# Patient Record
Sex: Female | Born: 1995 | Hispanic: No | Marital: Single | Smoking: Current every day smoker
Health system: Southern US, Community
[De-identification: ages and names within clinical notes are randomized; demographics above are authoritative.]

## PROBLEM LIST (undated history)

## (undated) DIAGNOSIS — F419 Anxiety disorder, unspecified: Secondary | ICD-10-CM

---
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
## 2017-10-11 ENCOUNTER — Emergency Department (HOSPITAL_COMMUNITY): Payer: Medicaid Other

## 2017-10-11 ENCOUNTER — Other Ambulatory Visit: Payer: Self-pay

## 2017-10-11 ENCOUNTER — Encounter (HOSPITAL_COMMUNITY): Payer: Self-pay

## 2017-10-11 ENCOUNTER — Emergency Department (HOSPITAL_COMMUNITY)
Admission: EM | Admit: 2017-10-11 | Discharge: 2017-10-11 | Disposition: A | Discharge status: Home / Self Care | Payer: Medicaid Other | Attending: Emergency Medicine | Admitting: Emergency Medicine

## 2017-10-11 DIAGNOSIS — S161XXA Strain of muscle, fascia and tendon at neck level, initial encounter: Secondary | ICD-10-CM | POA: Diagnosis not present

## 2017-10-11 DIAGNOSIS — Y9389 Activity, other specified: Secondary | ICD-10-CM | POA: Diagnosis not present

## 2017-10-11 DIAGNOSIS — Y92410 Unspecified street and highway as the place of occurrence of the external cause: Secondary | ICD-10-CM | POA: Diagnosis not present

## 2017-10-11 DIAGNOSIS — S60222A Contusion of left hand, initial encounter: Secondary | ICD-10-CM

## 2017-10-11 DIAGNOSIS — R0789 Other chest pain: Secondary | ICD-10-CM | POA: Diagnosis not present

## 2017-10-11 DIAGNOSIS — F17200 Nicotine dependence, unspecified, uncomplicated: Secondary | ICD-10-CM | POA: Diagnosis not present

## 2017-10-11 DIAGNOSIS — S60052A Contusion of left little finger without damage to nail, initial encounter: Secondary | ICD-10-CM | POA: Insufficient documentation

## 2017-10-11 DIAGNOSIS — Y999 Unspecified external cause status: Secondary | ICD-10-CM | POA: Insufficient documentation

## 2017-10-11 DIAGNOSIS — R410 Disorientation, unspecified: Secondary | ICD-10-CM | POA: Insufficient documentation

## 2017-10-11 DIAGNOSIS — Z79899 Other long term (current) drug therapy: Secondary | ICD-10-CM | POA: Diagnosis not present

## 2017-10-11 DIAGNOSIS — M545 Low back pain: Secondary | ICD-10-CM | POA: Insufficient documentation

## 2017-10-11 DIAGNOSIS — M7918 Myalgia, other site: Secondary | ICD-10-CM

## 2017-10-11 DIAGNOSIS — S199XXA Unspecified injury of neck, initial encounter: Secondary | ICD-10-CM | POA: Diagnosis present

## 2017-10-11 LAB — CBC
HEMATOCRIT: 35.6 % — AB (ref 36.0–46.0)
Hemoglobin: 12 g/dL (ref 12.0–15.0)
MCH: 30.6 pg (ref 26.0–34.0)
MCHC: 33.7 g/dL (ref 30.0–36.0)
MCV: 90.8 fL (ref 78.0–100.0)
Platelets: 271 10*3/uL (ref 150–400)
RBC: 3.92 MIL/uL (ref 3.87–5.11)
RDW: 13.6 % (ref 11.5–15.5)
WBC: 5.7 10*3/uL (ref 4.0–10.5)

## 2017-10-11 LAB — BASIC METABOLIC PANEL
ANION GAP: 8 (ref 5–15)
BUN: 7 mg/dL (ref 6–20)
CHLORIDE: 108 mmol/L (ref 101–111)
CO2: 24 mmol/L (ref 22–32)
Calcium: 9.4 mg/dL (ref 8.9–10.3)
Creatinine, Ser: 0.92 mg/dL (ref 0.44–1.00)
GFR calc Af Amer: >60 mL/min (ref >60)
GFR calc non Af Amer: >60 mL/min (ref >60)
Glucose, Bld: 88 mg/dL (ref 65–99)
POTASSIUM: 3.8 mmol/L (ref 3.5–5.1)
Sodium: 140 mmol/L (ref 135–145)

## 2017-10-11 LAB — I-STAT BETA HCG BLOOD, ED (MC, WL, AP ONLY): I-stat hCG, quantitative: <5 m[IU]/mL (ref <5)

## 2017-10-11 MED ORDER — NAPROXEN 500 MG PO TABS: 500.0000 mg | ORAL_TABLET | Freq: Two times a day (BID) | ORAL | 0 refills | Status: AC

## 2017-10-11 MED ORDER — SODIUM CHLORIDE 0.9 % IV BOLUS
500.0000 mL | Freq: Once | INTRAVENOUS | Status: AC
  Administered 2017-10-11: 500 mL via INTRAVENOUS

## 2017-10-11 MED ORDER — FENTANYL CITRATE (PF) 100 MCG/2ML IJ SOLN
25.0000 ug | Freq: Once | INTRAMUSCULAR | Status: AC
  Administered 2017-10-11: 25 ug via INTRAVENOUS
  Filled 2017-10-11: qty 2

## 2017-10-11 MED ORDER — SODIUM CHLORIDE 0.9 % IV SOLN: INTRAVENOUS | Status: DC

## 2017-10-11 MED ORDER — IOHEXOL 300 MG/ML  SOLN
100.0000 mL | Freq: Once | INTRAMUSCULAR | Status: AC | PRN
  Administered 2017-10-11: 75 mL via INTRAVENOUS

## 2017-10-11 MED ORDER — ONDANSETRON HCL 4 MG/2ML IJ SOLN
4.0000 mg | Freq: Once | INTRAMUSCULAR | Status: AC
  Administered 2017-10-11: 4 mg via INTRAVENOUS
  Filled 2017-10-11: qty 2

## 2017-10-11 MED ORDER — SODIUM CHLORIDE 0.9 % IV SOLN
INTRAVENOUS | Status: DC
  Administered 2017-10-11: 100 mL/h via INTRAVENOUS

## 2017-10-11 NOTE — Discharge Instructions (Addendum)
Extensive work-up without any acute internal or bony injuries.  Do have a contusion to your little finger jammed joint.  Expected to be sore and stiff.  Expect to be sore and stiff in many places over the next few days.  Work note to be out of work for the next 4 days.  Take the Naprosyn on a regular basis to help with the muscle soreness.

## 2017-10-11 NOTE — ED Notes (Signed)
Discharge instructions discussed with pt and mother. Both states they understands and perform teach back. Pt DC stable via wc.
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
## 2017-10-11 NOTE — ED Notes (Signed)
Got patient on the monitor patient is resting with call bell in reach  ?
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
## 2018-03-14 ENCOUNTER — Encounter (HOSPITAL_BASED_OUTPATIENT_CLINIC_OR_DEPARTMENT_OTHER): Payer: Self-pay | Admitting: Emergency Medicine

## 2018-03-14 ENCOUNTER — Emergency Department (HOSPITAL_BASED_OUTPATIENT_CLINIC_OR_DEPARTMENT_OTHER)
Admission: EM | Admit: 2018-03-14 | Discharge: 2018-03-14 | Disposition: A | Discharge status: Home / Self Care | Payer: Medicaid Other | Attending: Emergency Medicine | Admitting: Emergency Medicine

## 2018-03-14 ENCOUNTER — Other Ambulatory Visit: Payer: Self-pay

## 2018-03-14 DIAGNOSIS — Z79899 Other long term (current) drug therapy: Secondary | ICD-10-CM | POA: Insufficient documentation

## 2018-03-14 DIAGNOSIS — F419 Anxiety disorder, unspecified: Secondary | ICD-10-CM | POA: Insufficient documentation

## 2018-03-14 DIAGNOSIS — F17210 Nicotine dependence, cigarettes, uncomplicated: Secondary | ICD-10-CM | POA: Insufficient documentation

## 2018-03-14 LAB — PREGNANCY, URINE: PREG TEST UR: NEGATIVE

## 2018-03-14 MED ORDER — HYDROXYZINE HCL 25 MG PO TABS
50.0000 mg | ORAL_TABLET | Freq: Once | ORAL | Status: AC
  Administered 2018-03-14: 50 mg via ORAL
  Filled 2018-03-14: qty 2

## 2018-03-14 MED ORDER — HYDROXYZINE HCL 25 MG PO TABS: 25.0000 mg | ORAL_TABLET | Freq: Four times a day (QID) | ORAL | 0 refills | Status: AC

## 2018-03-14 MED ORDER — HYDROXYZINE HCL 25 MG PO TABS: 25.0000 mg | ORAL_TABLET | Freq: Four times a day (QID) | ORAL | 0 refills | Status: DC

## 2018-03-14 NOTE — ED Provider Notes (Addendum)
[facility] EMERGENCY DEPARTMENT Provider Note   CSN: [id number] Arrival date & time: 03/14/18  1744     History   Chief Complaint Chief Complaint  Patient presents with  . Panic Attack    HPI Mackenzie Kelly is a 22 y.o. female presenting with her sister and father for anxiety that began at 4:30 PM this afternoon after waking up from a nap.  Patient describes her anxiety as feeling scared that causes her heart to race in her hands to occasionally tingle.  Patient denies numbness or tingling at this time.  States that this passes quickly when she stops hyperventilating.  Patient states that this anxiety lasted just prior to arrival here today.  Patient states that she is feeling well during examination.  Patient states that she has had anxiety in the past and has been prescribed hydroxyzine.  Patient states that hydroxyzine normally alleviates her anxiety however she has been out of this prescription for several months due to recent move.  Patient requesting a refill of her hydroxyzine today.  Patient denies recent stressors or concerns in her life.  States that she just feels anxious sometimes.  Patient states that she feels safe at home.  Patient states that her friends put her in the shower today to relieve her anxiety symptoms without success.  Patient is requesting a change of close here in the emergency department.  Patient denies suicidal or homicidal ideations.  HPI  Past Medical History:  Diagnosis Date  . Anxiety     There are no active problems to display for this patient.   History reviewed. No pertinent surgical history.   OB History   None      Home Medications    Prior to Admission medications   Medication Sig Start Date End Date Taking? Authorizing Provider  acetaminophen (TYLENOL) 500 MG tablet Take 1,000 mg by mouth every 6 (six) hours as needed for headache.    [provider]  cefdinir (OMNICEF) 300 MG capsule Take 300 mg by mouth 2  (two) times daily. 08/06/15   [provider]  cetirizine (ZYRTEC) 10 MG tablet Take 10 mg by mouth daily as needed for allergies.  08/06/15   [provider]  hydrOXYzine (ATARAX/VISTARIL) 25 MG tablet Take 1 tablet (25 mg total) by mouth every 6 (six) hours. 03/14/18   Harlene Salts A, PA-C  ibuprofen (ADVIL,MOTRIN) 200 MG tablet Take 400 mg by mouth every 6 (six) hours as needed for moderate pain.    [provider]  naproxen (NAPROSYN) 500 MG tablet Take 1 tablet (500 mg total) by mouth 2 (two) times daily. 10/11/17   Vanetta Mulders, MD  ondansetron (ZOFRAN) 4 MG tablet Take 1 tablet (4 mg total) by mouth every 6 (six) hours. 08/10/15   Marlon Pel, PA-C  promethazine (PHENERGAN) 25 MG tablet Take 1 tablet (25 mg total) by mouth every 6 (six) hours as needed for nausea or vomiting. Patient not taking: Reported on 05/26/2015 02/19/15   Gilda Crease, MD  sertraline (ZOLOFT) 100 MG tablet Take 100 mg by mouth daily. 07/21/15   [provider]  traMADol (ULTRAM) 50 MG tablet Take 1 tablet (50 mg total) by mouth every 6 (six) hours as needed. 08/10/15   Marlon Pel, PA-C    Family History No family history on file.  Social History Social History   Tobacco Use  . Smoking status: Current Every Day Smoker    Packs/day: 0.50    Types: Cigarettes  .  Smokeless tobacco: Never Used  Substance Use Topics  . Alcohol use: No  . Drug use: No     Allergies   Patient has no known allergies.   Review of Systems Review of Systems  Constitutional: Negative.  Negative for fever.  HENT: Negative.  Negative for congestion and rhinorrhea.   Eyes: Negative.  Negative for visual disturbance.  Respiratory: Negative.  Negative for shortness of breath.   Cardiovascular: Negative.  Negative for chest pain.  Gastrointestinal: Negative.  Negative for nausea and vomiting.  Genitourinary: Negative.  Negative for dysuria and hematuria.  Musculoskeletal:  Negative.  Negative for arthralgias and myalgias.  Skin: Negative.  Negative for rash.  Neurological: Negative for dizziness, syncope, weakness and headaches.       Bilateral hand tingling.  Resolved.  Psychiatric/Behavioral: Negative for hallucinations, self-injury and suicidal ideas. The patient is nervous/anxious.    Physical Exam Updated Vital Signs BP 96/68 (BP Location: Right Arm)   Pulse 63   Temp 98 F (36.7 C) (Oral)   Resp 16   Ht 5\' 4"  (1.626 m)   Wt 47.6 kg   LMP 02/22/2018   SpO2 100%   BMI 18.02 kg/m   Physical Exam  Constitutional: She is oriented to person, place, and time. She appears well-developed and well-nourished. No distress.  HENT:  Head: Normocephalic and atraumatic.  Right Ear: External ear normal.  Left Ear: External ear normal.  Nose: Nose normal.  Mouth/Throat: Uvula is midline and oropharynx is clear and moist.  Eyes: Pupils are equal, round, and reactive to light. Conjunctivae and EOM are normal.  No exopthalmos.  Neck: Trachea normal, normal range of motion, full passive range of motion without pain and phonation normal. Neck supple. No tracheal deviation present. No thyroid mass present.  Cardiovascular: Normal rate, regular rhythm, normal heart sounds and intact distal pulses.  Pulses:      Dorsalis pedis pulses are 2+ on the right side, and 2+ on the left side.       Posterior tibial pulses are 2+ on the right side, and 2+ on the left side.  Pulmonary/Chest: Effort normal and breath sounds normal. No respiratory distress.  Abdominal: Soft. Bowel sounds are normal. There is no tenderness. There is no rigidity, no rebound and no guarding.  Musculoskeletal: Normal range of motion. She exhibits no tenderness.       Right lower leg: Normal.       Left lower leg: Normal.  Feet:  Right Foot:  Protective Sensation: 3 sites tested. 3 sites sensed.  Left Foot:  Protective Sensation: 3 sites tested. 3 sites sensed.  Neurological: She is alert and  oriented to person, place, and time. She has normal strength. No sensory deficit. GCS eye subscore is 4. GCS verbal subscore is 5. GCS motor subscore is 6.  Speech is clear and goal oriented, follows commands Major Cranial nerves without deficit, no facial droop Normal strength in upper and lower extremities bilaterally including dorsiflexion and plantar flexion, strong and equal grip strength Sensation normal to light and sharp touch Moves extremities without ataxia, coordination intact Normal gait  Skin: Skin is warm and dry. Capillary refill takes less than 2 seconds.  Psychiatric: She has a normal mood and affect. Her behavior is normal.    ED Treatments / Results  Labs (all labs ordered are listed, but only abnormal results are displayed) Labs Reviewed  PREGNANCY, URINE    EKG EKG Interpretation  Date/Time:  Thursday March 14 2018 19:34:55 EDT  Ventricular Rate:  58 PR Interval:    QRS Duration: 77 QT Interval:  415 QTC Calculation: 408 R Axis:   76 Text Interpretation:  Sinus rhythm ST elev, probable normal early repol pattern No significant change since last tracing Confirmed by Alvira Monday ([id number]) on 03/14/2018 7:59:49 PM   Radiology No results found.  Procedures Procedures (including critical care time)  Medications Ordered in ED Medications  hydrOXYzine (ATARAX/VISTARIL) tablet 50 mg (50 mg Oral Given 03/14/18 1900)     Initial Impression / Assessment and Plan / ED Course  I have reviewed the triage vital signs and the nursing notes.  Pertinent labs & imaging results that were available during my care of the patient were reviewed by me and considered in my medical decision making (see chart for details).      Patient presenting for anxiety that began at 4:30 PM today.  Patient with history of similar episodes in the past.  Patient is now resting comfortably and in no acute distress.  This has resolved in emergency department.  Patient given single  dose of hydroxyzine and prescription for the next week.  Patient informed not to drive or operate machinery while taking hydroxyzine because it may make her drowsy.  Patient denies suicidal or homicidal ideations and states that she feels safe at home.  Patient with family at bedside who can help take care of her over the next few days.  Patient states that she has a pediatrician that she is able to follow-up with, patient given additional resources.  Patient given stress reduction handout.  EKG without abnormality reviewed by Dr. Dalene Seltzer Urine pregnancy test negative. Patient afebrile, not tachycardic, not hypotensive, well-appearing and in no acute distress.  At discharge patient states she is feeling well and is no longer feeling anxious.  Patient denying any and all pain and is requesting discharge.  At this time there does not appear to be any evidence of an acute emergency medical condition and the patient appears stable for discharge with appropriate outpatient follow up. Diagnosis was discussed with patient who verbalizes understanding of care plan and is agreeable to discharge. I have discussed return precautions with patient and family who verbalize understanding of return precautions. Patient strongly encouraged to follow-up with their PCP. All questions answered.  Patient's case discussed with Dr. Dalene Seltzer who agrees with hydroxyzine and discharge with outpatient follow-up.   Note: Portions of this report may have been transcribed using voice recognition software. Every effort was made to ensure accuracy; however, inadvertent computerized transcription errors may still be present.  Final Clinical Impressions(s) / ED Diagnoses   Final diagnoses:  Anxiety    ED Discharge Orders         Ordered    hydrOXYzine (ATARAX/VISTARIL) 25 MG tablet  Every 6 hours,   Status:  Discontinued     03/14/18 1958    hydrOXYzine (ATARAX/VISTARIL) 25 MG tablet  Every 6 hours     03/14/18 2000            Bill Salinas, PA-C 03/14/18 2013    Elizabeth Palau 03/14/18 2013    Alvira Monday, MD 03/15/18 1231

## 2018-03-14 NOTE — ED Notes (Signed)
Pt/family verbalized understanding of discharge instructions.   

## 2018-03-14 NOTE — Discharge Instructions (Signed)
Please return to the Emergency Department for any new or worsening symptoms or if your symptoms do not improve. Please be sure to follow up with your Primary Care Physician as soon as possible regarding your visit today. If you do not have a Primary Doctor please use the resources below to establish one. You may use the Vistaril as prescribed to help with your anxiety.  Please follow-up with your primary care provider as soon as possible for reevaluation and further medication management.  Contact a doctor if: You are not able to take your medicines as told. Your symptoms do not get better. Your symptoms get worse. Get help right away if: Your attacks seem different than your normal attacks. You have thoughts about hurting yourself or others. You take panic attack medicine and you have a side effect.  Do not take your medicine if  develop an itchy rash, swelling in your mouth or lips, or difficulty breathing.   RESOURCE GUIDE  Chronic Pain Problems: Contact [facility]  [phone] Patients need to be referred by their primary care doctor.  Insufficient Money for Medicine: Contact United Way:  call "211" or [facility] 610-615-2782.  No Primary Care Doctor: Call [facility]  [phone] - can help you locate a primary care doctor that  accepts your insurance, provides certain services, etc. Physician Referral Service- [phone]  Agencies that provide inexpensive medical care: [facility]  [phone] [facility]  [phone] [facility]  [phone] [facility]  [phone] [facility]  [phone] [facility]  [phone]  Medicaid-accepting [facility] Providers: [facility]- [address]  [phone], Mon-Fri 9am-7pm, Sat 9am-1pm [facility]- [address] [place], Suite [place]  [phone] [facility]- [address]  [place]  [phone] [facility]- [address]  [phone] Renaye Rakers- [address] [place], [address], [phone]  Only accepts [place] patients after they have their name  applied to their card  Self Pay (no insurance) in [facility]: Sickle Cell Patients: Dr Willey Blade, [facility]  [address] [place], [phone] [facility]- [address] [place]  [phone]       [facility] [place]- [address]       -     [facility]- see information above (Speak to [employer] if you do not have insurance)       -  [facility]- [address] [place], [phone]       -  [facility]- [address],  [phone]       -  [facility]- [address] [phone]       -  Dr Julio Sicks-  [address], [place], [phone]       -  [facility]- [address] [phone]       -  [facility] [address], [phone], also [address] [phone]       -    [facility]- [address] [place], [phone], 1st & 3rd Saturday   every month, 10am-1pm  1) Find a Doctor and Pay Out of Pocket Although you won't have to find out who is covered by your insurance plan, it is  a good idea to ask around and get recommendations. You will then need to call the office and see if the doctor you have chosen will accept you as a new patient and what types of options they offer for patients who are self-pay. Some doctors offer discounts or will set up payment plans for their patients who do not have insurance, but you will need to ask so you aren't surprised when you get to your appointment.  2) Contact Your Local Health Department Not all health departments have doctors that can see patients for sick visits, but many do, so it is worth a call to see if yours does. If you don't know where your local health department is, you can check in your  phone book. The CDC also has a tool to help you locate your state's health department, and many state websites also have listings of all of their local health departments.  3) Find a Walk-in Clinic If your illness is not likely to be very severe or complicated, you may want to try a walk in clinic. These are popping up all over the country in pharmacies, drugstores, and shopping centers. They're usually staffed by nurse practitioners or physician assistants that have been trained to treat common illnesses and complaints. They're usually fairly quick and inexpensive. However, if you have serious medical issues or chronic medical problems, these are probably not your best option  STD Testing [facility] [place], STD Clinic, [address], [place], phone [phone] or [phone].  Monday - Friday, call for an appointment. [facility] of [facility] [place], STD Clinic, [place], [place], phone [phone] or [phone].  Monday - Friday, call for an appointment.  Abuse/Neglect: [facility] Child Abuse Hotline (757)699-8143(336) 506 570 2059 [facility] Child Abuse Hotline (564) 201-12219181880290 (After Hours)  Emergency Shelter:  [facility] (838)626-3865(336) [phone]  Maternity Homes: Room at the [place] [facility] [phone]) [phone] [facility] [phone]  MRSA Hotline #:   9388471079726-124-8153  [facility] Resources  [facility] [place]  United Way [facility] [address]                 [address]         [address] [place]  [place]                                               [place]                              [place] Phone:  [phone]                                  Phone:  [phone]                   Phone:  [phone]  [facility], [phone] [facility] - [phone]- [phone]       -     [facility] in [place], [address],  [phone], [facility] Child Abuse Hotline 713-023-9652 or (404)622-8183 (After Hours)   Behavioral Health Services  Substance Abuse Resources: [facility] and Drug Services  905-772-1788 [facility] (608)306-2139 [facility] (516)850-8291 Floydene Flock (416)227-6380 Residential & Outpatient Substance Abuse Program  951-741-8820  Psychological Services: [facility]  [phone] [facility]  [phone] [facility], [phone] [place]. [address] [place], ACCESS LINE: 407-495-4632 or [phone], [employer]ur[employer]an.co.za  Dental Assistance  If unable to pay or uninsured, contact:  [facility] or [facility] to become qualified for the adult dental clinic.  Patients with Medicaid: [facility] [phone] [address], [phone] [address] [phone]  If unable to pay, or uninsured, contact [facility] [phone]) or [facility] [phone] in [place], [phone] in [facility]) to become qualified for the adult dental clinic   Other Low-Cost Community Dental Services: [facility]- [address] [place], [place], [place], [address], [phone], [address]. [address], 2nd and 4th Thursday of the month at 6:30am.  10 clients each day by appointment, can sometimes see walk-in patients if someone does not show for an appointment. [facility]- [address] Ether Griffins [place], [place], [address], [phone] [facility] [address], [place], [place], [address], [phone] [facility] Health Department- [phone] [facility] Health Department- [phone] [facility] [phone]

## 2018-03-14 NOTE — ED Triage Notes (Signed)
States she woke up at 1630 and started having anxiety

## 2018-03-14 NOTE — ED Notes (Signed)
Provided pt with paper scrubs per request because her clothes are wet.

## 2018-03-14 NOTE — ED Notes (Signed)
Pt sitting on bed texting, no acute distress, no noted anxiety
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
## 2020-02-20 ENCOUNTER — Encounter (HOSPITAL_COMMUNITY): Payer: Self-pay

## 2020-02-20 ENCOUNTER — Other Ambulatory Visit: Payer: Self-pay

## 2020-02-20 ENCOUNTER — Ambulatory Visit (HOSPITAL_COMMUNITY)
Admission: EM | Admit: 2020-02-20 | Discharge: 2020-02-20 | Disposition: A | Discharge status: Home / Self Care | Payer: Medicaid Other | Attending: Emergency Medicine | Admitting: Emergency Medicine

## 2020-02-20 DIAGNOSIS — Z760 Encounter for issue of repeat prescription: Secondary | ICD-10-CM

## 2020-02-20 DIAGNOSIS — F411 Generalized anxiety disorder: Secondary | ICD-10-CM

## 2020-02-20 DIAGNOSIS — K644 Residual hemorrhoidal skin tags: Secondary | ICD-10-CM

## 2020-02-20 MED ORDER — SERTRALINE HCL 100 MG PO TABS: 100.0000 mg | ORAL_TABLET | Freq: Every day | ORAL | 0 refills | Status: AC

## 2020-02-20 MED ORDER — HYDROCORTISONE (PERIANAL) 2.5 % EX CREA: 1.0000 "application " | TOPICAL_CREAM | Freq: Two times a day (BID) | CUTANEOUS | 0 refills | Status: AC

## 2020-02-20 NOTE — ED Triage Notes (Addendum)
Pt reports having pain when defecate x 1 week . States the stools have some red stripes.

## 2020-02-20 NOTE — ED Provider Notes (Addendum)
[facility]    CSN: [id number] Arrival date & time: 02/20/20  1440      History   Chief Complaint Chief Complaint  Patient presents with  . Blood In Stools    HPI Mackenzie Kelly is a 24 y.o. female presenting today for evaluation of rectal pain and blood in stool.  Patient reports over the past week she has had discomfort at her rectum with passing bowels.  Feels at the beginning and at the end of passing of bowel movement.  Has noticed bright red blood per rectum.  Initially just with wiping, but now is noticing with wiping as well as mixed within the stool.  Feels confident this is rectal bleeding.  Did start menstrual cycle today, but was having rectal bleeding prior to onset of cycle.  She has had abdominal pain for approximately 3 days, but feels like typical cramping associated with menstrual cycle.  Denies any nausea or vomiting.  Eating and drinking normally.  Has bowel movements every other day, denies significant straining.  Denies history of hemorrhoids or other GI problems.  Denies any known family history of GI problems.    HPI  Past Medical History:  Diagnosis Date  . Anxiety     There are no problems to display for this patient.   History reviewed. No pertinent surgical history.  OB History   No obstetric history on file.      Home Medications    Prior to Admission medications   Medication Sig Start Date End Date Taking? Authorizing Provider  acetaminophen (TYLENOL) 500 MG tablet Take 1,000 mg by mouth every 6 (six) hours as needed for headache.    [provider]  cefdinir (OMNICEF) 300 MG capsule Take 300 mg by mouth 2 (two) times daily. 08/06/15   [provider]  cetirizine (ZYRTEC) 10 MG tablet Take 10 mg by mouth daily as needed for allergies.  08/06/15   [provider]  hydrocortisone (ANUSOL-HC) 2.5 % rectal cream Place 1 application rectally 2 (two) times daily. 02/20/20   Keeanna Villafranca C, PA-C  hydrOXYzine  (ATARAX/VISTARIL) 25 MG tablet Take 1 tablet (25 mg total) by mouth every 6 (six) hours. 03/14/18   Harlene Salts A, PA-C  ibuprofen (ADVIL,MOTRIN) 200 MG tablet Take 400 mg by mouth every 6 (six) hours as needed for moderate pain.    [provider]  naproxen (NAPROSYN) 500 MG tablet Take 1 tablet (500 mg total) by mouth 2 (two) times daily. 10/11/17   Vanetta Mulders, MD  ondansetron (ZOFRAN) 4 MG tablet Take 1 tablet (4 mg total) by mouth every 6 (six) hours. 08/10/15   Marlon Pel, PA-C  promethazine (PHENERGAN) 25 MG tablet Take 1 tablet (25 mg total) by mouth every 6 (six) hours as needed for nausea or vomiting. Patient not taking: Reported on 05/26/2015 02/19/15   Gilda Crease, MD  sertraline (ZOLOFT) 100 MG tablet Take 1 tablet (100 mg total) by mouth daily. 02/20/20   Brieanne Mignone C, PA-C  traMADol (ULTRAM) 50 MG tablet Take 1 tablet (50 mg total) by mouth every 6 (six) hours as needed. 08/10/15   Marlon Pel, PA-C    Family History History reviewed. No pertinent family history.  Social History Social History   Tobacco Use  . Smoking status: Current Every Day Smoker    Packs/day: 0.50    Types: Cigarettes  . Smokeless tobacco: Never Used  Vaping Use  . Vaping Use: Never used  Substance Use Topics  .  Alcohol use: No  . Drug use: No     Allergies   Patient has no known allergies.   Review of Systems Review of Systems  Constitutional: Negative for fever.  Respiratory: Negative for shortness of breath.   Cardiovascular: Negative for chest pain.  Gastrointestinal: Positive for abdominal pain, blood in stool and rectal pain. Negative for diarrhea, nausea and vomiting.  Genitourinary: Negative for dysuria, flank pain, genital sores, hematuria, menstrual problem, vaginal bleeding, vaginal discharge and vaginal pain.  Musculoskeletal: Negative for back pain.  Skin: Negative for rash.  Neurological: Negative for dizziness, light-headedness and  headaches.     Physical Exam Triage Vital Signs ED Triage Vitals [02/20/20 1651]  Enc Vitals Group     BP 95/62     Pulse Rate (!) 55     Resp 15     Temp 98.1 F (36.7 C)     Temp Source Oral     SpO2 97 %     Weight      Height      Head Circumference      Peak Flow      Pain Score 5     Pain Loc      Pain Edu?      Excl. in GC?    No data found.  Updated Vital Signs BP 95/62 (BP Location: Left Arm)   Pulse (!) 55   Temp 98.1 F (36.7 C) (Oral)   Resp 15   LMP 02/20/2020 (Exact Date)   SpO2 97%   Visual Acuity Right Eye Distance:   Left Eye Distance:   Bilateral Distance:    Right Eye Near:   Left Eye Near:    Bilateral Near:     Physical Exam Vitals and nursing note reviewed.  Constitutional:      Appearance: She is well-developed.     Comments: No acute distress  HENT:     Head: Normocephalic and atraumatic.     Nose: Nose normal.  Eyes:     Conjunctiva/sclera: Conjunctivae normal.  Cardiovascular:     Rate and Rhythm: Normal rate.  Pulmonary:     Effort: Pulmonary effort is normal. No respiratory distress.  Abdominal:     General: There is no distension.     Comments: Soft, nondistended, tender to palpation to bilateral lower quadrants and suprapubic area, negative rebound, negative Rovsing, negative McBurney's  Genitourinary:    Comments: Rectum with external hemorrhoid noted at 3 o'clock position, tender to touch, no surrounding induration No masses palpated on DRE Musculoskeletal:        General: Normal range of motion.     Cervical back: Neck supple.  Skin:    General: Skin is warm and dry.  Neurological:     Mental Status: She is alert and oriented to person, place, and time.      UC Treatments / Results  Labs (all labs ordered are listed, but only abnormal results are displayed) Labs Reviewed - No data to display  EKG   Radiology No results found.  Procedures Procedures (including critical care time)  Medications  Ordered in UC Medications - No data to display  Initial Impression / Assessment and Plan / UC Course  I have reviewed the triage vital signs and the nursing notes.  Pertinent labs & imaging results that were available during my care of the patient were reviewed by me and considered in my medical decision making (see chart for details).     External hemorrhoid-no sign  of perirectal abscess at this time.  Treating with Anusol, sitz bath's and symptomatic and supportive care.  Anxiety/med refill-refilled Zoloft x3 months.  Provided contact for primary care for further refills.  Discussed strict return precautions. Patient verbalized understanding and is agreeable with plan.  Final Clinical Impressions(s) / UC Diagnoses   Final diagnoses:  External hemorrhoid  Medication refill  Generalized anxiety disorder     Discharge Instructions     You have an external hemorrhoid-read attached on hemorrhoids Apply Anusol cream twice daily Soak in sitz bath(see attached) twice daily as you are able May also try soaking on warm tea bag to help shrink hemorrhoid Please drink plenty of fluids as well as may use stool softener/Colace twice daily to help with passing bowels Follow-up if symptoms not improving or worsening    ED Prescriptions    Medication Sig Dispense Auth. Provider   hydrocortisone (ANUSOL-HC) 2.5 % rectal cream Place 1 application rectally 2 (two) times daily. 30 g Keylin Podolsky C, PA-C   sertraline (ZOLOFT) 100 MG tablet Take 1 tablet (100 mg total) by mouth daily. 90 tablet Anni Hocevar, [place] C, PA-C     PDMP not reviewed this encounter.   Lew Dawes, PA-C 02/20/20 1741    Lew Dawes, PA-C 02/20/20 1741

## 2020-02-20 NOTE — Discharge Instructions (Signed)
You have an external hemorrhoid-read attached on hemorrhoids Apply Anusol cream twice daily Soak in sitz bath(see attached) twice daily as you are able May also try soaking on warm tea bag to help shrink hemorrhoid Please drink plenty of fluids as well as may use stool softener/Colace twice daily to help with passing bowels Follow-up if symptoms not improving or worsening

## 2022-10-26 ENCOUNTER — Ambulatory Visit
Admission: EM | Admit: 2022-10-26 | Discharge: 2022-10-26 | Disposition: A | Discharge status: Home / Self Care | Payer: Medicaid Other | Attending: Internal Medicine | Admitting: Internal Medicine

## 2022-10-26 DIAGNOSIS — M542 Cervicalgia: Secondary | ICD-10-CM | POA: Diagnosis not present

## 2022-10-26 DIAGNOSIS — S161XXA Strain of muscle, fascia and tendon at neck level, initial encounter: Secondary | ICD-10-CM | POA: Diagnosis not present

## 2022-10-26 MED ORDER — CYCLOBENZAPRINE HCL 5 MG PO TABS: 5.0000 mg | ORAL_TABLET | Freq: Two times a day (BID) | ORAL | 0 refills | Status: AC | PRN

## 2022-10-26 NOTE — Discharge Instructions (Signed)
Suspect you have a neck muscle strain.  I have prescribed you a muscle relaxer to take as needed.  It can make you drowsy so no driving or drinking alcohol with it.  Alternate ice and heat to affected area and you may use ibuprofen as needed.  Follow-up with orthopedist for further evaluation and management.
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
## 2022-10-26 NOTE — ED Triage Notes (Signed)
Pt c/o right sided neck pain x pt states she has been having neck pain on and off. But recently has been constant the last few days. Then subsided and then came back this morning.

## 2024-02-08 ENCOUNTER — Ambulatory Visit
Admission: EM | Admit: 2024-02-08 | Discharge: 2024-02-08 | Disposition: A | Discharge status: Home / Self Care | Attending: Family Medicine | Admitting: Family Medicine

## 2024-02-08 DIAGNOSIS — J029 Acute pharyngitis, unspecified: Secondary | ICD-10-CM | POA: Diagnosis not present

## 2024-02-08 DIAGNOSIS — B349 Viral infection, unspecified: Secondary | ICD-10-CM

## 2024-02-08 LAB — POCT RAPID STREP A (OFFICE): Rapid Strep A Screen: NEGATIVE

## 2024-02-08 LAB — POC SOFIA SARS ANTIGEN FIA: SARS Coronavirus 2 Ag: NEGATIVE

## 2024-02-08 MED ORDER — FLUTICASONE PROPIONATE 50 MCG/ACT NA SUSP: 1.0000 | Freq: Every day | NASAL | 0 refills | Status: AC

## 2024-02-08 NOTE — ED Triage Notes (Signed)
 Pt present with a sore throat and nasal congestion x three days. Reports pain when swallowing. She has not taken medicine for relief. Recently was moving and went to the beach over the weekend.

## 2024-02-08 NOTE — Discharge Instructions (Addendum)
 You tested negative for COVID and strep throat.  Please treat your symptoms with over the counter cough medication, tylenol or ibuprofen, humidifier, and rest.  Flonase  daily.  Viral illnesses can last 7-14 days. Please follow up with your PCP if your symptoms are not improving. Please go to the ER for any worsening symptoms. This includes but is not limited to fever you can not control with tylenol or ibuprofen, you are not able to stay hydrated, you have shortness of breath or chest pain.  Thank you for choosing [facility] for your healthcare needs. I hope you feel better soon!

## 2024-02-08 NOTE — ED Provider Notes (Addendum)
 [facility]    CSN: [id number] Arrival date & time: 02/08/24  1301      History   Chief Complaint Chief Complaint  Patient presents with   Sore Throat   Nasal Congestion    HPI Mackenzie Kelly is a 28 y.o. female  presents for evaluation of URI symptoms for 3 days. Patient reports associated symptoms of cough, congestion, sore throat. Denies N/V/D, fevers, ear pain, body aches, shortness of breath. Patient does note have a hx of asthma. Patient is  an active smoker.   Reports sick contacts partner.  Pt has taken nothing OTC for symptoms. Pt has no other concerns at this time.    Sore Throat    Past Medical History:  Diagnosis Date   Anxiety     There are no active problems to display for this patient.   History reviewed. No pertinent surgical history.  OB History   No obstetric history on file.      Home Medications    Prior to Admission medications   Medication Sig Start Date End Date Taking? Authorizing Provider  fluticasone  (FLONASE ) 50 MCG/ACT nasal spray Place 1 spray into both nostrils daily. 02/08/24  Yes Tani Virgo, Jodi R, NP  acetaminophen (TYLENOL) 500 MG tablet Take 1,000 mg by mouth every 6 (six) hours as needed for headache.    [provider]  cefdinir (OMNICEF) 300 MG capsule Take 300 mg by mouth 2 (two) times daily. 08/06/15   [provider]  cetirizine (ZYRTEC) 10 MG tablet Take 10 mg by mouth daily as needed for allergies.  08/06/15   [provider]  cyclobenzaprine  (FLEXERIL ) 5 MG tablet Take 1 tablet (5 mg total) by mouth 2 (two) times daily as needed for muscle spasms. 10/26/22   Hazen Darryle BRAVO, FNP  hydrocortisone  (ANUSOL -HC) 2.5 % rectal cream Place 1 application rectally 2 (two) times daily. 02/20/20   Wieters, Hallie C, PA-C  hydrOXYzine  (ATARAX /VISTARIL ) 25 MG tablet Take 1 tablet (25 mg total) by mouth every 6 (six) hours. 03/14/18   Donah Riis A, PA-C  ibuprofen (ADVIL,MOTRIN) 200 MG tablet Take 400 mg by  mouth every 6 (six) hours as needed for moderate pain.    [provider]  naproxen  (NAPROSYN ) 500 MG tablet Take 1 tablet (500 mg total) by mouth 2 (two) times daily. 10/11/17   Zackowski, Scott, MD  ondansetron  (ZOFRAN ) 4 MG tablet Take 1 tablet (4 mg total) by mouth every 6 (six) hours. 08/10/15   Levora Riggs, PA-C  promethazine  (PHENERGAN ) 25 MG tablet Take 1 tablet (25 mg total) by mouth every 6 (six) hours as needed for nausea or vomiting. Patient not taking: Reported on 05/26/2015 02/19/15   Haze Lonni PARAS, MD  sertraline  (ZOLOFT ) 100 MG tablet Take 1 tablet (100 mg total) by mouth daily. 02/20/20   Wieters, Hallie C, PA-C  traMADol  (ULTRAM ) 50 MG tablet Take 1 tablet (50 mg total) by mouth every 6 (six) hours as needed. 08/10/15   Levora Riggs, PA-C    Family History History reviewed. No pertinent family history.  Social History Social History   Tobacco Use   Smoking status: Every Day    Current packs/day: 0.50    Types: Cigarettes   Smokeless tobacco: Never  Vaping Use   Vaping status: Never Used  Substance Use Topics   Alcohol use: No   Drug use: No     Allergies   Patient has no known allergies.   Review of Systems Review of  Systems  HENT:  Positive for congestion and sore throat.   Respiratory:  Positive for cough.      Physical Exam Triage Vital Signs ED Triage Vitals  Encounter Vitals Group     BP 02/08/24 1329 92/63     Girls Systolic BP Percentile --      Girls Diastolic BP Percentile --      Boys Systolic BP Percentile --      Boys Diastolic BP Percentile --      Pulse Rate 02/08/24 1329 (!) 58     Resp 02/08/24 1329 17     Temp 02/08/24 1329 98 F (36.7 C)     Temp Source 02/08/24 1329 Oral     SpO2 02/08/24 1329 98 %     Weight --      Height --      Head Circumference --      Peak Flow --      Pain Score 02/08/24 1328 0     Pain Loc --      Pain Education --      Exclude from Growth Chart --    No data  found.  Updated Vital Signs BP 92/63 (BP Location: Left Arm)   Pulse (!) 58   Temp 98 F (36.7 C) (Oral)   Resp 17   LMP 02/07/2024 (Exact Date)   SpO2 98%   Visual Acuity Right Eye Distance:   Left Eye Distance:   Bilateral Distance:    Right Eye Near:   Left Eye Near:    Bilateral Near:     Physical Exam Vitals and nursing note reviewed.  Constitutional:      General: She is not in acute distress.    Appearance: She is well-developed. She is not ill-appearing.  HENT:     Head: Normocephalic and atraumatic.     Right Ear: Tympanic membrane and ear canal normal.     Left Ear: Tympanic membrane and ear canal normal.     Nose: Congestion present.     Mouth/Throat:     Mouth: Mucous membranes are moist.     Pharynx: Oropharynx is clear. Uvula midline. Posterior oropharyngeal erythema present.     Tonsils: No tonsillar exudate or tonsillar abscesses.  Eyes:     Conjunctiva/sclera: Conjunctivae normal.     Pupils: Pupils are equal, round, and reactive to light.  Cardiovascular:     Rate and Rhythm: Regular rhythm. Bradycardia present.     Heart sounds: Normal heart sounds.     Comments: Heart rate 58 Pulmonary:     Effort: Pulmonary effort is normal.     Breath sounds: Normal breath sounds.  Musculoskeletal:     Cervical back: Normal range of motion and neck supple.  Lymphadenopathy:     Cervical: No cervical adenopathy.  Skin:    General: Skin is warm and dry.  Neurological:     General: No focal deficit present.     Mental Status: She is alert and oriented to person, place, and time.  Psychiatric:        Mood and Affect: Mood normal.        Behavior: Behavior normal.      UC Treatments / Results  Labs (all labs ordered are listed, but only abnormal results are displayed) Labs Reviewed  POC SOFIA SARS ANTIGEN FIA  POCT RAPID STREP A (OFFICE)    EKG   Radiology No results found.  Procedures Procedures (including critical care time)  Medications  Ordered  in UC Medications - No data to display  Initial Impression / Assessment and Plan / UC Course  I have reviewed the triage vital signs and the nursing notes.  Pertinent labs & imaging results that were available during my care of the patient were reviewed by me and considered in my medical decision making (see chart for details).     Reviewed exam and symptoms with patient.  No red flags.  Negative rapid strep and COVID testing.  Discussed viral illness and symptomatic treatment.  Flonase  daily.  Advise rest fluids and PCP follow-up if symptoms do not improve.  ER precautions reviewed. Final Clinical Impressions(s) / UC Diagnoses   Final diagnoses:  Sore throat  Viral illness     Discharge Instructions      You tested negative for COVID and strep throat.  Please treat your symptoms with over the counter cough medication, tylenol or ibuprofen, humidifier, and rest.  Flonase  daily.  Viral illnesses can last 7-14 days. Please follow up with your PCP if your symptoms are not improving. Please go to the ER for any worsening symptoms. This includes but is not limited to fever you can not control with tylenol or ibuprofen, you are not able to stay hydrated, you have shortness of breath or chest pain.  Thank you for choosing [facility] for your healthcare needs. I hope you feel better soon!      ED Prescriptions     Medication Sig Dispense Auth. Provider   fluticasone  (FLONASE ) 50 MCG/ACT nasal spray Place 1 spray into both nostrils daily. 15.8 mL Kori Goins, Jodi R, NP      PDMP not reviewed this encounter.   Loreda Myla SAUNDERS, NP 02/08/24 1459    Loreda Myla SAUNDERS, NP 02/08/24 [phone]
